# Patient Record
Sex: Male | Born: 1985 | Race: White | Hispanic: No | Marital: Married | Smoking: Former smoker
Health system: Southern US, Community
[De-identification: ages and names within clinical notes are randomized; demographics above are authoritative.]

## PROBLEM LIST (undated history)

## (undated) DIAGNOSIS — Z9109 Other allergy status, other than to drugs and biological substances: Secondary | ICD-10-CM

## (undated) DIAGNOSIS — F419 Anxiety disorder, unspecified: Secondary | ICD-10-CM

## (undated) DIAGNOSIS — R519 Headache, unspecified: Secondary | ICD-10-CM

## (undated) HISTORY — PX: ANTERIOR CRUCIATE LIGAMENT REPAIR: SHX115

## (undated) HISTORY — PX: WISDOM TOOTH EXTRACTION: SHX21

## (undated) HISTORY — DX: Headache, unspecified: R51.9

---
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
## 2015-01-08 ENCOUNTER — Ambulatory Visit: Payer: Self-pay | Admitting: Licensed Clinical Social Worker

## 2015-01-22 ENCOUNTER — Ambulatory Visit: Payer: Self-pay | Admitting: Licensed Clinical Social Worker

## 2015-02-04 ENCOUNTER — Ambulatory Visit (INDEPENDENT_AMBULATORY_CARE_PROVIDER_SITE_OTHER): Payer: BLUE CROSS/BLUE SHIELD | Admitting: Licensed Clinical Social Worker

## 2015-02-04 DIAGNOSIS — F419 Anxiety disorder, unspecified: Secondary | ICD-10-CM

## 2015-02-21 ENCOUNTER — Ambulatory Visit (INDEPENDENT_AMBULATORY_CARE_PROVIDER_SITE_OTHER): Payer: BLUE CROSS/BLUE SHIELD | Admitting: Licensed Clinical Social Worker

## 2015-02-21 DIAGNOSIS — F419 Anxiety disorder, unspecified: Secondary | ICD-10-CM | POA: Diagnosis not present

## 2015-03-05 ENCOUNTER — Ambulatory Visit (INDEPENDENT_AMBULATORY_CARE_PROVIDER_SITE_OTHER): Payer: BLUE CROSS/BLUE SHIELD | Admitting: Licensed Clinical Social Worker

## 2015-03-05 DIAGNOSIS — F419 Anxiety disorder, unspecified: Secondary | ICD-10-CM | POA: Diagnosis not present

## 2015-03-18 ENCOUNTER — Ambulatory Visit (INDEPENDENT_AMBULATORY_CARE_PROVIDER_SITE_OTHER): Payer: BLUE CROSS/BLUE SHIELD | Admitting: Licensed Clinical Social Worker

## 2015-03-18 DIAGNOSIS — F419 Anxiety disorder, unspecified: Secondary | ICD-10-CM | POA: Diagnosis not present

## 2015-04-21 ENCOUNTER — Ambulatory Visit: Payer: BLUE CROSS/BLUE SHIELD | Admitting: Licensed Clinical Social Worker

## 2015-06-24 DIAGNOSIS — Z23 Encounter for immunization: Secondary | ICD-10-CM | POA: Diagnosis not present

## 2015-06-25 ENCOUNTER — Ambulatory Visit (INDEPENDENT_AMBULATORY_CARE_PROVIDER_SITE_OTHER): Payer: BLUE CROSS/BLUE SHIELD | Admitting: Licensed Clinical Social Worker

## 2015-06-25 DIAGNOSIS — F419 Anxiety disorder, unspecified: Secondary | ICD-10-CM

## 2015-07-23 ENCOUNTER — Ambulatory Visit: Payer: Self-pay | Admitting: Licensed Clinical Social Worker

## 2015-12-19 DIAGNOSIS — Z23 Encounter for immunization: Secondary | ICD-10-CM | POA: Diagnosis not present

## 2016-05-04 DIAGNOSIS — M545 Low back pain: Secondary | ICD-10-CM | POA: Diagnosis not present

## 2016-05-21 ENCOUNTER — Ambulatory Visit (INDEPENDENT_AMBULATORY_CARE_PROVIDER_SITE_OTHER): Payer: BLUE CROSS/BLUE SHIELD | Admitting: Licensed Clinical Social Worker

## 2016-05-21 DIAGNOSIS — F419 Anxiety disorder, unspecified: Secondary | ICD-10-CM

## 2016-07-22 ENCOUNTER — Encounter (HOSPITAL_COMMUNITY): Payer: Self-pay | Admitting: Emergency Medicine

## 2016-07-22 ENCOUNTER — Emergency Department (HOSPITAL_COMMUNITY)
Admission: EM | Admit: 2016-07-22 | Discharge: 2016-07-22 | Disposition: A | Discharge status: Home / Self Care | Payer: BLUE CROSS/BLUE SHIELD | Attending: Emergency Medicine | Admitting: Emergency Medicine

## 2016-07-22 DIAGNOSIS — L299 Pruritus, unspecified: Secondary | ICD-10-CM | POA: Diagnosis not present

## 2016-07-22 DIAGNOSIS — L559 Sunburn, unspecified: Secondary | ICD-10-CM | POA: Diagnosis not present

## 2016-07-22 DIAGNOSIS — L550 Sunburn of first degree: Secondary | ICD-10-CM | POA: Diagnosis not present

## 2016-07-22 MED ORDER — HYDROXYZINE HCL 50 MG/ML IM SOLN
100.0000 mg | Freq: Four times a day (QID) | INTRAMUSCULAR | Status: DC | PRN
  Administered 2016-07-22: 100 mg via INTRAMUSCULAR
  Filled 2016-07-22: qty 2

## 2016-07-22 MED ORDER — HYDROXYZINE HCL 25 MG PO TABS: 50.0000 mg | ORAL_TABLET | Freq: Four times a day (QID) | ORAL | 0 refills | Status: DC

## 2016-07-22 MED ORDER — IBUPROFEN 800 MG PO TABS: 800.0000 mg | ORAL_TABLET | Freq: Three times a day (TID) | ORAL | 0 refills | Status: DC

## 2016-07-22 MED ORDER — DOXEPIN HCL 10 MG PO CAPS: 10.0000 mg | ORAL_CAPSULE | Freq: Three times a day (TID) | ORAL | 0 refills | Status: DC | PRN

## 2016-07-22 MED ORDER — DOXEPIN HCL 10 MG PO CAPS
10.0000 mg | ORAL_CAPSULE | Freq: Once | ORAL | Status: AC
  Administered 2016-07-22: 10 mg via ORAL
  Filled 2016-07-22: qty 1

## 2016-07-22 MED ORDER — KETOROLAC TROMETHAMINE 60 MG/2ML IM SOLN
60.0000 mg | Freq: Once | INTRAMUSCULAR | Status: AC
  Administered 2016-07-22: 60 mg via INTRAMUSCULAR
  Filled 2016-07-22: qty 2

## 2016-07-22 NOTE — ED Provider Notes (Signed)
[facility]EMER[facility]ENCY DEPT Provider Note   CSN: [id number] Arrival date & time: 07/22/16  0115     History   Chief Complaint Chief Complaint  Patient presents with  . Pruritis    HPI Benjamin Flores is a 31 y.o. male.  Patient presents to the emergency department for evaluation of severe pruritus secondary to sunburn. Patient reports that he was in the sun several days ago and suffered a sunburn on his face and shoulders. Since then he has had severe, constant pruritus as well as burning pain in the area. He has been taking Allegra and Benadryl without relief. He saw his doctor yesterday and started prednisone which he thinks made it worse. No oral lesions noted. No tongue swelling, throat swelling, difficulty breathing. Patient reports that he has had similar responses to sunburns in the past, but the itching is worse this time than ever before.      History reviewed. No pertinent past medical history.  There are no active problems to display for this patient.   Past Surgical History:  Procedure Laterality Date  . ANTERIOR CRUCIATE LIGAMENT REPAIR Right   . WISDOM TOOTH EXTRACTION         Home Medications    Prior to Admission medications   Not on File    Family History No family history on file.  Social History Social History  Substance Use Topics  . Smoking status: Never Smoker  . Smokeless tobacco: Never Used  . Alcohol use Yes     Comment: socially     Allergies   Patient has no known allergies.   Review of Systems Review of Systems  Skin:       Sunburn, itching  All other systems reviewed and are negative.    Physical Exam Updated Vital Signs Ht 5\' 9"  (1.753 m)   Wt 79.4 kg (175 lb)   BMI 25.84 kg/m   Physical Exam  Constitutional: He is oriented to person, place, and time. He appears well-developed and well-nourished. No distress.  HENT:  Head: Normocephalic and atraumatic.  Right Ear: Hearing normal.  Left Ear: Hearing normal.  Nose:  Nose normal.  Mouth/Throat: Oropharynx is clear and moist and mucous membranes are normal.  Eyes: Conjunctivae and EOM are normal. Pupils are equal, round, and reactive to light.  Neck: Normal range of motion. Neck supple.  Cardiovascular: Regular rhythm, S1 normal and S2 normal.  Exam reveals no gallop and no friction rub.   No murmur heard. Pulmonary/Chest: Effort normal and breath sounds normal. No respiratory distress. He exhibits no tenderness.  Abdominal: Soft. Normal appearance and bowel sounds are normal. There is no hepatosplenomegaly. There is no tenderness. There is no rebound, no guarding, no tenderness at McBurney's point and negative Murphy's sign. No hernia.  Musculoskeletal: Normal range of motion.  Neurological: He is alert and oriented to person, place, and time. He has normal strength. No cranial nerve deficit or sensory deficit. Coordination normal. GCS eye subscore is 4. GCS verbal subscore is 5. GCS motor subscore is 6.  Skin: Skin is warm, dry and intact. No rash noted. There is erythema (Face and apex of shoulders bilaterally). No cyanosis.  Psychiatric: He has a normal mood and affect. His speech is normal and behavior is normal. Thought content normal.  Nursing note and vitals reviewed.    ED Treatments / Results  Labs (all labs ordered are listed, but only abnormal results are displayed) Labs Reviewed - No data to display  EKG  EKG  Interpretation None       Radiology No results found.  Procedures Procedures (including critical care time)  Medications Ordered in ED Medications  doxepin (SINEQUAN) capsule 10 mg (not administered)  hydrOXYzine (VISTARIL) injection 100 mg (not administered)  ketorolac (TORADOL) injection 60 mg (not administered)     Initial Impression / Assessment and Plan / ED Course  I have reviewed the triage vital signs and the nursing notes.  Pertinent labs & imaging results that were available during my care of the patient  were reviewed by me and considered in my medical decision making (see chart for details).     Patient presents with itching associated with sunburn which happened a couple of days ago. Patient reports similar reactions in the past. Patient has mild sunburn on examination, but reports deep burning pain and itching. He was treated with Vistaril, Toradol, doxepin. Will continue Vistaril and doxepin as an outpatient as needed, petroleum jelly topical application to the areas.  Final Clinical Impressions(s) / ED Diagnoses   Final diagnoses:  Pruritus  Sunburn    New Prescriptions New Prescriptions   No medications on file     Gilda CreasePollina, Kristee Angus J, MD 07/22/16 [phone]

## 2016-07-22 NOTE — ED Notes (Signed)
ED Provider at bedside. 

## 2016-07-22 NOTE — ED Notes (Signed)
Pt at vending machines eating.

## 2016-07-22 NOTE — ED Notes (Signed)
Patient walked out front door without addressing staff.  Unsure if patient was leaving, will continue to monitor and d/c if he does not return.

## 2016-07-22 NOTE — ED Triage Notes (Signed)
Pt to ED from home c/o severe itching to shoulders and arms since Monday - pt reports serious allergy to the sun and got burnt Monday on his arms and shoulders. Pt states he's tried OTC Allegra and Benadryl without relief and saw his PCP yesterday (Wednesday) when the itching worsened, and was prescribed Prednisone (took first day's dose). Pt states the itching feels like it is down to his bones, he can't sleep. Denies pain or any other symptoms.

## 2016-07-22 NOTE — ED Notes (Signed)
Patient now back in lobby, sitting, waiting

## 2016-07-22 NOTE — ED Notes (Signed)
Patient updated on wait time and apologized for delays 

## 2016-07-30 DIAGNOSIS — F419 Anxiety disorder, unspecified: Secondary | ICD-10-CM | POA: Diagnosis not present

## 2016-07-30 DIAGNOSIS — E781 Pure hyperglyceridemia: Secondary | ICD-10-CM | POA: Diagnosis not present

## 2016-07-30 DIAGNOSIS — Z0000 Encounter for general adult medical examination without abnormal findings: Secondary | ICD-10-CM | POA: Diagnosis not present

## 2016-07-30 DIAGNOSIS — B354 Tinea corporis: Secondary | ICD-10-CM | POA: Diagnosis not present

## 2016-09-17 ENCOUNTER — Emergency Department (HOSPITAL_COMMUNITY): Payer: BLUE CROSS/BLUE SHIELD

## 2016-09-17 ENCOUNTER — Emergency Department (HOSPITAL_COMMUNITY)
Admission: EM | Admit: 2016-09-17 | Discharge: 2016-09-17 | Disposition: A | Discharge status: Home / Self Care | Payer: BLUE CROSS/BLUE SHIELD | Attending: Emergency Medicine | Admitting: Emergency Medicine

## 2016-09-17 ENCOUNTER — Encounter (HOSPITAL_COMMUNITY): Payer: Self-pay | Admitting: Nurse Practitioner

## 2016-09-17 DIAGNOSIS — F430 Acute stress reaction: Secondary | ICD-10-CM | POA: Diagnosis not present

## 2016-09-17 DIAGNOSIS — Z79899 Other long term (current) drug therapy: Secondary | ICD-10-CM | POA: Insufficient documentation

## 2016-09-17 DIAGNOSIS — R079 Chest pain, unspecified: Secondary | ICD-10-CM | POA: Diagnosis not present

## 2016-09-17 DIAGNOSIS — R9431 Abnormal electrocardiogram [ECG] [EKG]: Secondary | ICD-10-CM | POA: Diagnosis not present

## 2016-09-17 DIAGNOSIS — F410 Panic disorder [episodic paroxysmal anxiety] without agoraphobia: Secondary | ICD-10-CM | POA: Diagnosis not present

## 2016-09-17 HISTORY — DX: Anxiety disorder, unspecified: F41.9

## 2016-09-17 HISTORY — DX: Other allergy status, other than to drugs and biological substances: Z91.09

## 2016-09-17 LAB — CBC
HCT: 43 % (ref 39.0–52.0)
Hemoglobin: 15 g/dL (ref 13.0–17.0)
MCH: 31.4 pg (ref 26.0–34.0)
MCHC: 34.9 g/dL (ref 30.0–36.0)
MCV: 90 fL (ref 78.0–100.0)
PLATELETS: 164 10*3/uL (ref 150–400)
RBC: 4.78 MIL/uL (ref 4.22–5.81)
RDW: 11.9 % (ref 11.5–15.5)
WBC: 3.8 10*3/uL — ABNORMAL LOW (ref 4.0–10.5)

## 2016-09-17 LAB — I-STAT TROPONIN, ED: TROPONIN I, POC: 0.01 ng/mL (ref 0.00–0.08)

## 2016-09-17 LAB — BASIC METABOLIC PANEL
Anion gap: 10 (ref 5–15)
BUN: 15 mg/dL (ref 6–20)
CALCIUM: 9.2 mg/dL (ref 8.9–10.3)
CO2: 24 mmol/L (ref 22–32)
CREATININE: 0.9 mg/dL (ref 0.61–1.24)
Chloride: 105 mmol/L (ref 101–111)
GFR calc non Af Amer: >60 mL/min (ref >60)
Glucose, Bld: 99 mg/dL (ref 65–99)
Potassium: 3.9 mmol/L (ref 3.5–5.1)
SODIUM: 139 mmol/L (ref 135–145)

## 2016-09-17 NOTE — ED Triage Notes (Signed)
Pt presents with c/o abnormal ekg. He was sent from his primary care provider this morning for further evaluation. He went in to the office today for several day history of intermittent chest pressure. He reports the pressure has lasted for several hours at a time and seems to start during stressful events. He reports feeling his heart race during the episodes. He denies weakness, dizziness, shortness of breath.

## 2016-09-17 NOTE — Discharge Instructions (Signed)
We believe the changes in your EKG do not represent decreased blood flow to your heart.   We believe your chest pressure was due to a panic attack triggered by anxiety.  Get help right away if: You experience panic attack symptoms that are different than your usual symptoms. You have serious thoughts about hurting yourself or others. You are taking medicine for panic attacks and have a serious side effect.

## 2016-09-17 NOTE — ED Provider Notes (Signed)
[facility] Provider Note   CSN: [id number] Arrival date & time: 09/17/16  1228     History   Chief Complaint Chief Complaint  Patient presents with  . Abnormal ECG    HPI Benjamin Flores is a 31 y.o. male.  Patient with a history of allergies and anxiety and presenting from his primary care provider with an abnormal EKG. He was seen by his primary care this mornig for nausea and diarrhea following an episode of chest tightness. His chest tightness began last night following stressful conversations with family. It was severe chest tightness that kept him awake most of the night and did not subside until around 8-9 in the morning. He is not currently feeling chest tightness. He has had a previous episode of chest tightness following a similar stressful situation. He denies weakness, dizziness, or shortness of breath during the episode.      Past Medical History:  Diagnosis Date  . Anxiety   . Environmental allergies     There are no active problems to display for this patient.   Past Surgical History:  Procedure Laterality Date  . ANTERIOR CRUCIATE LIGAMENT REPAIR Right   . WISDOM TOOTH EXTRACTION         Home Medications    Prior to Admission medications   Medication Sig Start Date End Date Taking? Authorizing Provider  diphenhydrAMINE (BENADRYL) 25 MG tablet Take 25 mg by mouth every 6 (six) hours as needed for allergies.    [provider]  doxepin (SINEQUAN) 10 MG capsule Take 1 capsule (10 mg total) by mouth 3 (three) times daily as needed (itching). 07/22/16   Gilda CreasePollina, Christopher J, MD  fexofenadine (ALLEGRA) 180 MG tablet Take 180 mg by mouth daily.    [provider]  hydrOXYzine (ATARAX/VISTARIL) 25 MG tablet Take 2-4 tablets (50-100 mg total) by mouth every 6 (six) hours. 07/22/16   Gilda CreasePollina, Christopher J, MD  ibuprofen (ADVIL,MOTRIN) 800 MG tablet Take 1 tablet (800 mg total) by mouth 3 (three) times daily. 07/22/16   Gilda CreasePollina, Christopher  J, MD  Multiple Vitamin (MULTIVITAMIN WITH MINERALS) TABS tablet Take 1 tablet by mouth daily.    [provider]  predniSONE (DELTASONE) 10 MG tablet Take 10-20 mg by mouth See admin instructions. Day 1: Two tablets before breakfast, one after lunch, one after dinner, and two at bedtime. If started late in the day, take two tablets every hour for three hours, unless otherwise directed by prescriber. Day 2: One tablet before breakfast, one after lunch, one after dinner, and two at bedtime Day 3: One tablet before breakfast, one after lunch, one after dinner, and one at bedtime Day 4: One tablet before breakfast, one after lunch, and one at bedtime Day 5: One tablet before breakfast and one at bedtime Day 6: One tablet before breakfast    [provider]    Family History History reviewed. No pertinent family history.  Social History Social History  Substance Use Topics  . Smoking status: Never Smoker  . Smokeless tobacco: Never Used  . Alcohol use Yes     Comment: socially     Allergies   Patient has no known allergies.   Review of Systems Review of Systems  Constitutional: Negative for chills and fever.  HENT: Negative for ear pain and sore throat.   Eyes: Negative for pain and visual disturbance.  Respiratory: Negative for cough and shortness of breath.   Cardiovascular: Negative for chest pain and palpitations.  Gastrointestinal:  Negative for abdominal pain and vomiting.  Genitourinary: Negative for dysuria and hematuria.  Musculoskeletal: Negative for arthralgias and back pain.  Skin: Negative for color change and rash.  Neurological: Negative for seizures and syncope.  All other systems reviewed and are negative.    Physical Exam Updated Vital Signs BP 127/87   Pulse 76   Temp 98 F (36.7 C) (Oral)   Resp 16   Ht 5\' 9"  (1.753 m)   Wt 79.4 kg (175 lb)   SpO2 98%   BMI 25.84 kg/m   Physical Exam  Constitutional: He appears well-developed  and well-nourished.  HENT:  Head: Normocephalic and atraumatic.  Eyes: Conjunctivae are normal.  Neck: Neck supple.  Cardiovascular: Normal rate and regular rhythm.   No murmur heard. Pulmonary/Chest: Effort normal and breath sounds normal. No respiratory distress.  Abdominal: Soft. There is no tenderness.  Musculoskeletal: He exhibits no edema.  Neurological: He is alert.  Skin: Skin is warm and dry.  Psychiatric: He has a normal mood and affect.  Nursing note and vitals reviewed.    ED Treatments / Results  Labs (all labs ordered are listed, but only abnormal results are displayed) Labs Reviewed  CBC - Abnormal; Notable for the following:       Result Value   WBC 3.8 (*)    All other components within normal limits  BASIC METABOLIC PANEL  I-STAT TROPONIN, ED    EKG  EKG Interpretation  Date/Time:  Friday September 17 2016 12:31:59 EDT Ventricular Rate:  72 PR Interval:  142 QRS Duration: 80 QT Interval:  380 QTC Calculation: 416 R Axis:   96 Text Interpretation:  Normal sinus rhythm Right atrial enlargement Rightward axis Pulmonary disease pattern Abnormal ECG No STEMI.  Confirmed by Alona BeneLong, Joshua [phone]([id number]) on 09/17/2016 12:41:32 PM       Radiology Dg Chest 2 View  Result Date: 09/17/2016 CLINICAL DATA:  Chest pain. EXAM: CHEST  2 VIEW COMPARISON:  None. FINDINGS: The heart size and mediastinal contours are within normal limits. Both lungs are clear. No pneumothorax or pleural effusion is noted. The visualized skeletal structures are unremarkable. IMPRESSION: No active cardiopulmonary disease. Electronically Signed   By: Lupita RaiderJames  Green Jr, M.D.   On: 09/17/2016 12:50    Procedures Procedures (including critical care time)  Medications Ordered in ED Medications - No data to display   Initial Impression / Assessment and Plan / ED Course  I have reviewed the triage vital signs and the nursing notes.  Pertinent labs & imaging results that were available during my care  of the patient were reviewed by me and considered in my medical decision making (see chart for details).    Patient presenting with abnormal EKG from primary care office following episode of chest tightness. The chest tightness occurred in the setting of recent stressful conversations with family and with a history of anxiety. History is suspicious for panic attack. CXR shows   - EKG: nonischemic Normal sinus rhythm with sinus arrhythmia Right atrial enlargement Rightward axis Pulmonary disease pattern Abnormal ECG - Troponin 0.01 - CBC & BMP: No significant derangements  Work up negative for cardiac origin of chest pressure. Suspect panic attack based on presentation, HPI and history of anxiety.   Final Clinical Impressions(s) / ED Diagnoses   Final diagnoses:  None    New Prescriptions New Prescriptions   No medications on file     Beola CordMelvin, Alexander, MD 09/17/16 1425    Long, Arlyss RepressJoshua G, MD 09/17/16  1606  

## 2016-09-17 NOTE — ED Notes (Signed)
Pt states that he woke up in middle of night nauseated with joint pain in all joints.

## 2016-10-13 ENCOUNTER — Ambulatory Visit: Payer: BLUE CROSS/BLUE SHIELD | Admitting: Cardiology

## 2016-11-09 ENCOUNTER — Ambulatory Visit: Payer: BLUE CROSS/BLUE SHIELD | Admitting: Cardiology

## 2016-12-10 DIAGNOSIS — Z23 Encounter for immunization: Secondary | ICD-10-CM | POA: Diagnosis not present

## 2017-01-09 DIAGNOSIS — J029 Acute pharyngitis, unspecified: Secondary | ICD-10-CM | POA: Diagnosis not present

## 2017-01-09 DIAGNOSIS — M791 Myalgia, unspecified site: Secondary | ICD-10-CM | POA: Diagnosis not present

## 2017-01-09 DIAGNOSIS — J101 Influenza due to other identified influenza virus with other respiratory manifestations: Secondary | ICD-10-CM | POA: Diagnosis not present

## 2017-02-14 DIAGNOSIS — A084 Viral intestinal infection, unspecified: Secondary | ICD-10-CM | POA: Diagnosis not present

## 2017-02-24 ENCOUNTER — Ambulatory Visit: Payer: BLUE CROSS/BLUE SHIELD | Admitting: Cardiology

## 2017-02-24 ENCOUNTER — Encounter: Payer: Self-pay | Admitting: Cardiology

## 2017-02-24 VITALS — BP 102/78 | Ht 69.0 in | Wt 167.4 lb

## 2017-02-24 DIAGNOSIS — R9431 Abnormal electrocardiogram [ECG] [EKG]: Secondary | ICD-10-CM

## 2017-02-24 DIAGNOSIS — R079 Chest pain, unspecified: Secondary | ICD-10-CM | POA: Diagnosis not present

## 2017-02-24 NOTE — Patient Instructions (Signed)
No  Change with treatment       Your physician recommends that you schedule a follow-up appointment on an as needed basis

## 2017-02-24 NOTE — Progress Notes (Signed)
PCP: Patient, No Pcp Per  - Dr. Zachery Dauer - [facility] Note: Chief Complaint  Patient presents with  . new patient     shooting chest pain  occassionally, no shortness of breathe , noswelling    HPI: Benjamin Flores is a 32 y.o. male with a PMH below who presents today for delayed emergency room follow-up for abnormal EKG & chest pain.  He was sent to the emergency room by primary care provider because of an abnormal EKG.  Benjamin Flores was seen on by Dr. Alona Bene on September 17, 2016 for chest discomfort.  The EKG did not suggest ischemia, however was read as abnormal because of suggestion of rightward axis (96 degrees).  Chest x-ray was normal.  Plan was to follow-up with primary care physician, but was provided cardiology contact information on discharge. --The original reason for presenting to the PCP was nausea and diarrhea following an episode of chest tightness that began after a stressful conversation with the family.  He describes severe chest tightness that kept him awake most of the night until 9 in the morning.  This was his second such episode.  I cannot tell if he has been seen by his PCP since the emergency room visit or not.  Recent Hospitalizations: None recorded since August  Studies Personally Reviewed - (if available, images/films reviewed: From Epic Chart or Care Everywhere)  none  Interval History: Benjamin Flores indicates that he has been doing relatively well since the emergency room follow-up.  He has seen his PCP, but not for this issue.  Basically, he agreed to come to this visit because his wife is concerned.  The concern based on the issue relating to the emergency room was, that was the worst episode of similar Sx - stress & anxiety related to family issues.  Felt significant tightness in chest -no SOB associated with it.  Off & on short, fleeting pains in the chest - not associated with activity.  Out of shape - so would get SOB & tired with vigorous activity,  but nothing unexpected.   Naturally anxious.    Otherwise no further chest pain or shortness of breath with rest or exertion.  No PND, orthopnea or edema. No palpitations, lightheadedness, dizziness, weakness or syncope/near syncope. No TIA/amaurosis fugax symptoms.  No claudication.  ROS: A comprehensive was performed. Review of Systems  Constitutional: Negative for malaise/fatigue.  HENT: Negative for congestion and nosebleeds.   Respiratory: Negative for cough and shortness of breath.   Gastrointestinal: Negative for blood in stool and melena.  Genitourinary: Negative for hematuria.  Neurological: Negative for dizziness and weakness.  Endo/Heme/Allergies: Negative for environmental allergies.  Psychiatric/Behavioral: The patient is nervous/anxious (Generally anxious person.  But no depression.).   All other systems reviewed and are negative.   I have reviewed and (if needed) personally updated the patient's problem list, medications, allergies, past medical and surgical history, social and family history.   Past Medical History:  Diagnosis Date  . Anxiety   . Environmental allergies     Past Surgical History:  Procedure Laterality Date  . ANTERIOR CRUCIATE LIGAMENT REPAIR Right   . WISDOM TOOTH EXTRACTION      Current Meds  Medication Sig  . Multiple Vitamin (MULTIVITAMIN WITH MINERALS) TABS tablet Take 1 tablet by mouth daily.    No Known Allergies  Social History   Tobacco Use  . Smoking status: Former Smoker    Types: Cigarettes    Last  attempt to quit: 2012    Years since quitting: 7.0  . Smokeless tobacco: Never Used  Substance Use Topics  . Alcohol use: Yes    Comment: socially  . Drug use: No   Social History   Social History Narrative   Married - 1 child (1.5 y/o dtr).   Used to smoke 1/2 ppd x 6-8 yrs - quit 6 yr ago    family history includes Alzheimer's disease in his maternal grandmother; Bipolar disorder in his father; Dementia in his  father and paternal grandfather; Hypertension in his father; Prostate cancer in his father.  NO premature CAD, DM.  Both sides have "high cholesterol' Grandparents - some had dementia.   Wt Readings from Last 3 Encounters:  02/24/17 167 lb 6.4 oz (75.9 kg)  09/17/16 175 lb (79.4 kg)  07/22/16 175 lb (79.4 kg)    PHYSICAL EXAM BP 102/78 (BP Location: Right Arm, Patient Position: Sitting, Cuff Size: Normal)   Ht 5\' 9"  (1.753 m)   Wt 167 lb 6.4 oz (75.9 kg)   BMI 24.72 kg/m  Physical Exam  Constitutional: He is oriented to person, place, and time. He appears well-developed and well-nourished. No distress.  HENT:  Head: Normocephalic and atraumatic.  Mouth/Throat: Oropharynx is clear and moist. No oropharyngeal exudate.  Eyes: EOM are normal.  Neck: Normal range of motion. Neck supple. No hepatojugular reflux and no JVD present. Carotid bruit is not present.  Cardiovascular: Normal rate, regular rhythm and intact distal pulses.  No extrasystoles are present. PMI is not displaced. Exam reveals no gallop and no friction rub.  No murmur heard. Pulmonary/Chest: Effort normal and breath sounds normal. No respiratory distress. He has no wheezes. He has no rales.  Abdominal: Soft. Bowel sounds are normal. He exhibits no distension. There is no tenderness. There is no rebound.  Musculoskeletal: Normal range of motion. He exhibits no edema.  Neurological: He is alert and oriented to person, place, and time.  Skin: Skin is warm and dry.  Psychiatric: He has a normal mood and affect. His behavior is normal. Judgment and thought content normal.  Nursing note and vitals reviewed.    Adult ECG Report  Rate: 53 ;  Rhythm: sinus bradycardia and Otherwise normal axis, intervals and durations.;   Narrative Interpretation: Normal EKG with exception of sinus bradycardia.  The previously noted rightward axis deviation is no longer present.  He does have low voltage in the I and aVL.  (Suspect previous EKG  was related to lead placement)   Other studies Reviewed: Additional studies/ records that were reviewed today include:  Recent Labs:   Lab Results  Component Value Date   CREATININE 0.90 09/17/2016   BUN 15 09/17/2016   NA 139 09/17/2016   K 3.9 09/17/2016   CL 105 09/17/2016   CO2 24 09/17/2016      ASSESSMENT / PLAN:  Benjamin Flores is an otherwise healthy young man expected to size may be hypercholesterolemia and stress/anxiety.  His EKG is relatively normal.  He has not had any significant chest pain since his ER visit.  I suspect if this episode was clearly related to a panic and anxiety type attack.  Does not sound cardiac in nature.  With him not having any exertional chest tightness or pressure or dyspnea, I do not think there is any need to further evaluate this with a stress test.  If he were to have recurrent symptoms, we would check a stress test. On exam he did not  have any murmurs or rubs or gallops.  With follow-up EKG showing normal axis, I do not see any reason for checking it with an echocardiogram.  I reassured him that his symptoms are noncardiac.  He can follow-up with his PCP.  Problem List Items Addressed This Visit    Chest pain with low risk for cardiac etiology - Primary (Chronic)   Relevant Orders   EKG 12-Lead (Completed)   Nonspecific abnormal electrocardiogram (ECG) (EKG)   Relevant Orders   EKG 12-Lead (Completed)      Current medicines are reviewed at length with the patient today. (+/- concerns) none The following changes have been made:None  Patient Instructions  No  Change with treatment       Your physician recommends that you schedule a follow-up appointment on an as needed basis    Studies Ordered:   Orders Placed This Encounter  Procedures  . EKG 12-Lead      Bryan Lemmaavid Bryana Froemming, M.D., M.S. Interventional Cardiologist   Pager # [phone] Phone # [phone] [address] [place], [place] [address]   Thank you  for choosing [facility]!!

## 2017-02-27 ENCOUNTER — Encounter: Payer: Self-pay | Admitting: Cardiology

## 2017-06-14 DIAGNOSIS — N529 Male erectile dysfunction, unspecified: Secondary | ICD-10-CM | POA: Diagnosis not present

## 2017-06-14 DIAGNOSIS — E7800 Pure hypercholesterolemia, unspecified: Secondary | ICD-10-CM | POA: Diagnosis not present

## 2017-06-14 DIAGNOSIS — E039 Hypothyroidism, unspecified: Secondary | ICD-10-CM | POA: Diagnosis not present

## 2017-12-07 DIAGNOSIS — Z23 Encounter for immunization: Secondary | ICD-10-CM | POA: Diagnosis not present

## 2018-03-09 DIAGNOSIS — M25561 Pain in right knee: Secondary | ICD-10-CM | POA: Diagnosis not present

## 2018-03-09 DIAGNOSIS — M7651 Patellar tendinitis, right knee: Secondary | ICD-10-CM | POA: Diagnosis not present

## 2018-03-13 DIAGNOSIS — R05 Cough: Secondary | ICD-10-CM | POA: Diagnosis not present

## 2018-03-13 DIAGNOSIS — J069 Acute upper respiratory infection, unspecified: Secondary | ICD-10-CM | POA: Diagnosis not present

## 2018-03-22 DIAGNOSIS — M25561 Pain in right knee: Secondary | ICD-10-CM | POA: Diagnosis not present

## 2018-03-29 DIAGNOSIS — M25561 Pain in right knee: Secondary | ICD-10-CM | POA: Diagnosis not present

## 2018-04-06 DIAGNOSIS — N5201 Erectile dysfunction due to arterial insufficiency: Secondary | ICD-10-CM | POA: Diagnosis not present

## 2018-04-06 DIAGNOSIS — Z3009 Encounter for other general counseling and advice on contraception: Secondary | ICD-10-CM | POA: Diagnosis not present

## 2018-04-06 DIAGNOSIS — R351 Nocturia: Secondary | ICD-10-CM | POA: Diagnosis not present

## 2019-02-05 DIAGNOSIS — F419 Anxiety disorder, unspecified: Secondary | ICD-10-CM | POA: Diagnosis not present

## 2019-07-23 ENCOUNTER — Other Ambulatory Visit: Payer: BLUE CROSS/BLUE SHIELD

## 2020-06-29 ENCOUNTER — Other Ambulatory Visit: Payer: Self-pay

## 2020-06-29 ENCOUNTER — Emergency Department (HOSPITAL_BASED_OUTPATIENT_CLINIC_OR_DEPARTMENT_OTHER)
Admission: EM | Admit: 2020-06-29 | Discharge: 2020-06-29 | Disposition: A | Discharge status: Home / Self Care | Payer: 59 | Attending: Emergency Medicine | Admitting: Emergency Medicine

## 2020-06-29 ENCOUNTER — Emergency Department (HOSPITAL_BASED_OUTPATIENT_CLINIC_OR_DEPARTMENT_OTHER): Payer: 59

## 2020-06-29 ENCOUNTER — Encounter (HOSPITAL_BASED_OUTPATIENT_CLINIC_OR_DEPARTMENT_OTHER): Payer: Self-pay | Admitting: Emergency Medicine

## 2020-06-29 DIAGNOSIS — Z87891 Personal history of nicotine dependence: Secondary | ICD-10-CM | POA: Insufficient documentation

## 2020-06-29 DIAGNOSIS — R531 Weakness: Secondary | ICD-10-CM | POA: Diagnosis not present

## 2020-06-29 DIAGNOSIS — R519 Headache, unspecified: Secondary | ICD-10-CM | POA: Diagnosis present

## 2020-06-29 LAB — CBC WITH DIFFERENTIAL/PLATELET
Abs Immature Granulocytes: 0.01 10*3/uL (ref 0.00–0.07)
Basophils Absolute: 0 10*3/uL (ref 0.0–0.1)
Basophils Relative: 1 %
Eosinophils Absolute: 0.2 10*3/uL (ref 0.0–0.5)
Eosinophils Relative: 4 %
HCT: 43.1 % (ref 39.0–52.0)
Hemoglobin: 14.8 g/dL (ref 13.0–17.0)
Immature Granulocytes: 0 %
Lymphocytes Relative: 39 %
Lymphs Abs: 1.7 10*3/uL (ref 0.7–4.0)
MCH: 31.4 pg (ref 26.0–34.0)
MCHC: 34.3 g/dL (ref 30.0–36.0)
MCV: 91.5 fL (ref 80.0–100.0)
Monocytes Absolute: 0.5 10*3/uL (ref 0.1–1.0)
Monocytes Relative: 12 %
Neutro Abs: 1.9 10*3/uL (ref 1.7–7.7)
Neutrophils Relative %: 44 %
Platelets: 182 10*3/uL (ref 150–400)
RBC: 4.71 MIL/uL (ref 4.22–5.81)
RDW: 12 % (ref 11.5–15.5)
WBC: 4.3 10*3/uL (ref 4.0–10.5)
nRBC: 0 % (ref 0.0–0.2)

## 2020-06-29 LAB — COMPREHENSIVE METABOLIC PANEL
ALT: 32 U/L (ref 0–44)
AST: 25 U/L (ref 15–41)
Albumin: 4.3 g/dL (ref 3.5–5.0)
Alkaline Phosphatase: 52 U/L (ref 38–126)
Anion gap: 9 (ref 5–15)
BUN: 18 mg/dL (ref 6–20)
CO2: 25 mmol/L (ref 22–32)
Calcium: 8.6 mg/dL — ABNORMAL LOW (ref 8.9–10.3)
Chloride: 105 mmol/L (ref 98–111)
Creatinine, Ser: 0.82 mg/dL (ref 0.61–1.24)
GFR, Estimated: >60 mL/min (ref >60)
Glucose, Bld: 90 mg/dL (ref 70–99)
Potassium: 3.8 mmol/L (ref 3.5–5.1)
Sodium: 139 mmol/L (ref 135–145)
Total Bilirubin: 0.5 mg/dL (ref 0.3–1.2)
Total Protein: 6.6 g/dL (ref 6.5–8.1)

## 2020-06-29 MED ORDER — SODIUM CHLORIDE 0.9 % IV BOLUS
1000.0000 mL | Freq: Once | INTRAVENOUS | Status: AC
  Administered 2020-06-29: 1000 mL via INTRAVENOUS

## 2020-06-29 MED ORDER — KETOROLAC TROMETHAMINE 30 MG/ML IJ SOLN
30.0000 mg | Freq: Once | INTRAMUSCULAR | Status: AC
  Administered 2020-06-29: 30 mg via INTRAVENOUS
  Filled 2020-06-29: qty 1

## 2020-06-29 MED ORDER — NAPROXEN 500 MG PO TABS: 500.0000 mg | ORAL_TABLET | Freq: Two times a day (BID) | ORAL | 0 refills | Status: AC

## 2020-06-29 NOTE — ED Notes (Signed)
Frontal headache and pressure for two weeks Denies sinus drainage, vision changes, N/V, fever, neck stiffness Had tele health appointment last week - CT was ordered and insurance declined to pay. Pt here because the pain woke him up this morning around 4am Pain/pressure is effecting lifestyle. Has tried tylenol and ibuprofen but nothing has help.

## 2020-06-29 NOTE — ED Provider Notes (Signed)
[facility] EMERGENCY DEPARTMENT Provider Note  CSN: [id number] Arrival date & time: 06/29/20 1253    History Chief Complaint  Patient presents with  . Headache    HPI  Benjamin Flores is a 35 y.o. male with no significant PMH reports about 2 weeks of waxing and waning frontal headache, not associated with sinus pressure/drainage, fever, neck stiffness, photophobia, N/V/D. He has felt generally weak, worse with exertion. He had a televisit with his PCP office earlier this month and a CT was ordered but denied by his insurance. He has been taking OTC meds without relief. Pain woke him up from sleep around 4am more severe than previous. He denies any nose or eye drainage. No arm or leg weakness/numbness.    Past Medical History:  Diagnosis Date  . Anxiety   . Environmental allergies     Past Surgical History:  Procedure Laterality Date  . ANTERIOR CRUCIATE LIGAMENT REPAIR Right   . WISDOM TOOTH EXTRACTION      Family History  Problem Relation Age of Onset  . Hypertension Father   . Prostate cancer Father   . Dementia Father        vascular  . Bipolar disorder Father   . Alzheimer's disease Maternal Grandmother   . Dementia Paternal Grandfather        lots of psychiatric issues    Social History   Tobacco Use  . Smoking status: Former Smoker    Types: Cigarettes    Quit date: 2012    Years since quitting: 10.3  . Smokeless tobacco: Never Used  Vaping Use  . Vaping Use: Never used  Substance Use Topics  . Alcohol use: Yes    Comment: socially  . Drug use: No     Home Medications Prior to Admission medications   Medication Sig Start Date End Date Taking? Authorizing Provider  naproxen (NAPROSYN) 500 MG tablet Take 1 tablet (500 mg total) by mouth 2 (two) times daily. 06/29/20  Yes Pollyann Savoy, MD  Multiple Vitamin (MULTIVITAMIN WITH MINERALS) TABS tablet Take 1 tablet by mouth daily.    [provider]     Allergies    Patient  has no known allergies.   Review of Systems   Review of Systems A comprehensive review of systems was completed and negative except as noted in HPI.    Physical Exam BP 118/86   Pulse 62   Temp 98.8 F (37.1 C) (Oral)   Resp 14   Ht 5\' 9"  (1.753 m)   Wt 81.6 kg   SpO2 100%   BMI 26.58 kg/m   Physical Exam Vitals and nursing note reviewed.  Constitutional:      Appearance: Normal appearance.  HENT:     Head: Normocephalic and atraumatic.     Nose: Nose normal.     Mouth/Throat:     Mouth: Mucous membranes are moist.  Eyes:     Extraocular Movements: Extraocular movements intact.     Conjunctiva/sclera: Conjunctivae normal.     Comments: Pale sclera  Cardiovascular:     Rate and Rhythm: Normal rate.  Pulmonary:     Effort: Pulmonary effort is normal.     Breath sounds: Normal breath sounds.  Abdominal:     General: Abdomen is flat.     Palpations: Abdomen is soft.     Tenderness: There is no abdominal tenderness.  Musculoskeletal:        General: No swelling. Normal range of motion.  Cervical back: Neck supple.  Skin:    General: Skin is warm and dry.  Neurological:     General: No focal deficit present.     Mental Status: He is alert and oriented to person, place, and time.     Cranial Nerves: No cranial nerve deficit.     Sensory: No sensory deficit.     Motor: No weakness.     Gait: Gait normal.  Psychiatric:        Mood and Affect: Mood normal.      ED Results / Procedures / Treatments   Labs (all labs ordered are listed, but only abnormal results are displayed) Labs Reviewed  COMPREHENSIVE METABOLIC PANEL - Abnormal; Notable for the following components:      Result Value   Calcium 8.6 (*)    All other components within normal limits  CBC WITH DIFFERENTIAL/PLATELET    EKG None   Radiology CT Head Wo Contrast  Result Date: 06/29/2020 CLINICAL DATA:  Headache for 2 weeks EXAM: CT HEAD WITHOUT CONTRAST TECHNIQUE: Contiguous axial images  were obtained from the base of the skull through the vertex without intravenous contrast. COMPARISON:  None. FINDINGS: Brain: Ventricles and sulci are normal in size and configuration. There is no intracranial mass, hemorrhage, extra-axial fluid collection, or midline shift. Brain parenchyma appears unremarkable. No evident acute infarct. Vascular: No hyperdense vessel.  No evident vascular calcification. Skull: Bony calvarium appears intact. Sinuses/Orbits: Visualized paranasal sinuses are clear. Visualized orbits appear symmetric bilaterally. Other: Mastoid air cells are clear. IMPRESSION: Study within normal limits. Electronically Signed   By: Bretta Bang III M.D.   On: 06/29/2020 14:47    Procedures Procedures  Medications Ordered in the ED Medications  sodium chloride 0.9 % bolus 1,000 mL (0 mLs Intravenous Stopped 06/29/20 1521)  ketorolac (TORADOL) 30 MG/ML injection 30 mg (30 mg Intravenous Given 06/29/20 1426)     MDM Rules/Calculators/A&P MDM Patient with new onset persistent headache, now also with generalized weakness and exercise intolerance. Will check basic labs, send for CT. IVF in the meantime.  ED Course  I have reviewed the triage vital signs and the nursing notes.  Pertinent labs & imaging results that were available during my care of the patient were reviewed by me and considered in my medical decision making (see chart for details).  Clinical Course as of 06/29/20 1530  Sun Jun 29, 2020  1351 CBC is normal.  [CS]  1412 CMP is normal.  [CS]  1514 CT negative. Patient has had some relief after Toradol. No signs of life threatening cause of headache such as SAH or mass. Recommend Neurology follow up. Rx for NSAIDs. RTED for any other concerns.  [CS]    Clinical Course User Index [CS] Pollyann Savoy, MD    Final Clinical Impression(s) / ED Diagnoses Final diagnoses:  Bad headache    Rx / DC Orders ED Discharge Orders         Ordered    naproxen  (NAPROSYN) 500 MG tablet  2 times daily        06/29/20 1521           Pollyann Savoy, MD 06/29/20 1530

## 2020-06-29 NOTE — ED Triage Notes (Signed)
Pt arrives to ED with c/o of severe head pressure x2 weeks. Pt states he started with headache, brain fog, fatigue and now the pressure has gotten so severe he cannot perform normal daily activities.

## 2020-06-30 ENCOUNTER — Encounter: Payer: Self-pay | Admitting: Neurology

## 2020-09-05 NOTE — Progress Notes (Signed)
NEUROLOGY CONSULTATION NOTE  Benjamin Flores MRN: [id number] DOB: 06/27/1985  Referring provider: Dahlia Byes, NP Primary care provider: [facility]  Reason for consult:  headaches  Assessment/Plan:   Chronic daily headache - unclear how much may be complicated by medication-overuse Memory deficits  Start nortriptyline 10mg  at bedtime.  If no improvement in 4 weeks, contact me and we can increase to 25mg  at bedtime Limit use of pain relievers to no more than 2 days out of week to prevent risk of rebound or medication-overuse headache. Keep headache diary MRI of brain with and without contrast Check B12 and TSH Follow up 6 months    Subjective:  JAKWAN Flores is a 35 year old male with IBS and anxiety who presents for headaches.  History supplemented by ED and referring provider's notes.  About 3 months ago, he began experiencing headache.  Location varies, sometimes behind eyes/forehead, sometimes back of head.  It is persistent but intensity fluctuates.  Usually 3/10 intensity.  When 5/10 for several hours and occurs every other day. Sometimes associated tingling of the scalp.  He feels more forgetful and in a fog.  No associated neck pain, nausea, vomiting, photophobia, phonophobia, dizziness, visual disturbance, sinus pressure/drainage, focal numbness or weakness, neck stiffness or fever.  He had been taking ibuprofen and Aleve which no relief.  On 06/29/2020 he woke up from sleep at 4 AM with a particularly severe headache and was seen in the ED.  CT head personally reviewed was unremarkable.  He was treated with Toradol.  No prior history of migraines.  No preceding head trauma or illness.  No new environmental factors other than getting a new dog.  However typical allergies (seasonal) are itching eyes.  History of anxiety and reports increased stress since working from home and caring for his two small children.     Current acetaminophen or ibuprofen (takes  daily) Past ibuprofen, a muscle relaxer (cause drowsiness), naproxen  History of 2 concussions while playing sports.  History of alcohol poisoning when he was young.    PAST MEDICAL HISTORY: Past Medical History:  Diagnosis Date   Anxiety    Environmental allergies     PAST SURGICAL HISTORY: Past Surgical History:  Procedure Laterality Date   ANTERIOR CRUCIATE LIGAMENT REPAIR Right    WISDOM TOOTH EXTRACTION      MEDICATIONS: Current Outpatient Medications on File Prior to Visit  Medication Sig Dispense Refill   Multiple Vitamin (MULTIVITAMIN WITH MINERALS) TABS tablet Take 1 tablet by mouth daily.     naproxen (NAPROSYN) 500 MG tablet Take 1 tablet (500 mg total) by mouth 2 (two) times daily. 30 tablet 0   No current facility-administered medications on file prior to visit.    ALLERGIES: No Known Allergies  FAMILY HISTORY: Family History  Problem Relation Age of Onset   Hypertension Father    Prostate cancer Father    Dementia Father        vascular   Bipolar disorder Father    Alzheimer's disease Maternal Grandmother    Dementia Paternal Grandfather        lots of psychiatric issues    Objective:  Blood pressure 117/80, pulse 79, height 5\' 9"  (1.753 m), weight 183 lb 3.2 oz (83.1 kg), SpO2 97 %. General: No acute distress.  Patient appears well-groomed.   Head:  Normocephalic/atraumatic Eyes:  fundi examined but not visualized Neck: supple, no paraspinal tenderness, full range of motion Back: No paraspinal tenderness Heart: regular  rate and rhythm Lungs: Clear to auscultation bilaterally. Vascular: No carotid bruits. Neurological Exam: Mental status: alert and oriented to person, place, and time, recent and remote memory intact, fund of knowledge intact, attention and concentration intact, speech fluent and not dysarthric, language intact. Cranial nerves: CN I: not tested CN II: pupils equal, round and reactive to light, visual fields intact CN III, IV,  VI:  full range of motion, no nystagmus, no ptosis CN V: facial sensation intact. CN VII: upper and lower face symmetric CN VIII: hearing intact CN IX, X: gag intact, uvula midline CN XI: sternocleidomastoid and trapezius muscles intact CN XII: tongue midline Bulk & Tone: normal, no fasciculations. Motor:  muscle strength 5/5 throughout Sensation:  Pinprick, temperature and vibratory sensation intact. Deep Tendon Reflexes:  2+ throughout,  toes downgoing.   Finger to nose testing:  Without dysmetria.   Heel to shin:  Without dysmetria.   Gait:  Normal station and stride.  Romberg negative.    Thank you for allowing me to take part in the care of this patient.  Shon Millet, DO  CC: Dahlia Byes, NP

## 2020-09-08 ENCOUNTER — Other Ambulatory Visit: Payer: Self-pay

## 2020-09-08 ENCOUNTER — Encounter: Payer: Self-pay | Admitting: Neurology

## 2020-09-08 ENCOUNTER — Ambulatory Visit (INDEPENDENT_AMBULATORY_CARE_PROVIDER_SITE_OTHER): Payer: 59 | Admitting: Neurology

## 2020-09-08 ENCOUNTER — Other Ambulatory Visit (INDEPENDENT_AMBULATORY_CARE_PROVIDER_SITE_OTHER): Payer: 59

## 2020-09-08 VITALS — BP 117/80 | HR 79 | Ht 69.0 in | Wt 183.2 lb

## 2020-09-08 DIAGNOSIS — R413 Other amnesia: Secondary | ICD-10-CM

## 2020-09-08 DIAGNOSIS — R519 Headache, unspecified: Secondary | ICD-10-CM

## 2020-09-08 LAB — VITAMIN B12: Vitamin B-12: 456 pg/mL (ref 211–911)

## 2020-09-08 LAB — TSH: TSH: 3.04 u[IU]/mL (ref 0.35–5.50)

## 2020-09-08 MED ORDER — NORTRIPTYLINE HCL 10 MG PO CAPS: 10.0000 mg | ORAL_CAPSULE | Freq: Every day | ORAL | 5 refills | Status: DC

## 2020-09-08 NOTE — Patient Instructions (Signed)
Start nortriptyline 10mg  at bedtime.  Keep headache diary to keep track of headaches.  If no improvement in 4 weeks, contact me and I will increase dose. Limit use of pain relievers to no more than 2 days out of week to prevent risk of rebound or medication-overuse headache. MRI of brain with and without contrast Check B12 and TSH

## 2020-09-09 NOTE — Progress Notes (Signed)
Pt advised of her lab results.

## 2020-09-17 ENCOUNTER — Ambulatory Visit: Payer: Self-pay | Admitting: Neurology

## 2020-09-21 ENCOUNTER — Ambulatory Visit
Admission: RE | Admit: 2020-09-21 | Discharge: 2020-09-21 | Disposition: A | Discharge status: Home / Self Care | Payer: 59 | Source: Ambulatory Visit | Attending: Neurology | Admitting: Neurology

## 2020-09-21 ENCOUNTER — Other Ambulatory Visit: Payer: Self-pay

## 2020-09-21 DIAGNOSIS — R413 Other amnesia: Secondary | ICD-10-CM

## 2020-09-21 DIAGNOSIS — R519 Headache, unspecified: Secondary | ICD-10-CM

## 2020-09-21 MED ORDER — GADOBENATE DIMEGLUMINE 529 MG/ML IV SOLN
17.0000 mL | Freq: Once | INTRAVENOUS | Status: AC | PRN
  Administered 2020-09-21: 17 mL via INTRAVENOUS

## 2020-09-25 ENCOUNTER — Telehealth: Payer: Self-pay | Admitting: Neurology

## 2020-09-25 NOTE — Telephone Encounter (Signed)
Called patient and informed him of MRI results  "Pls let patient know brain MRI was normal, no evidence of tumor, stroke, or bleed. Thanks"  Patient verbalized understanding and had no further questions or concerns.

## 2020-09-25 NOTE — Telephone Encounter (Signed)
Please see results note.

## 2020-09-25 NOTE — Telephone Encounter (Signed)
Pt called in to see if we have gotten the results of his MRI back?

## 2020-10-01 NOTE — Progress Notes (Signed)
Pt advised of his MRI [facility] results.

## 2020-11-12 ENCOUNTER — Other Ambulatory Visit: Payer: Self-pay | Admitting: Neurology

## 2020-12-15 ENCOUNTER — Ambulatory Visit: Payer: 59 | Attending: Internal Medicine

## 2020-12-15 ENCOUNTER — Other Ambulatory Visit (HOSPITAL_BASED_OUTPATIENT_CLINIC_OR_DEPARTMENT_OTHER): Payer: Self-pay

## 2020-12-15 DIAGNOSIS — Z23 Encounter for immunization: Secondary | ICD-10-CM

## 2020-12-15 MED ORDER — FLUARIX QUADRIVALENT 0.5 ML IM SUSY
PREFILLED_SYRINGE | INTRAMUSCULAR | 0 refills | Status: DC
  Filled 2020-12-15: qty 0.5, 1d supply, fill #0

## 2020-12-15 NOTE — Progress Notes (Signed)
   Covid-19 Vaccination Clinic  Name:  Benjamin Flores    MRN: [id number] DOB: [birth year]  12/15/2020  Mr. Benjamin Flores was observed post Covid-19 immunization for 15 minutes without incident. He was provided with Vaccine Information Sheet and instruction to access the V-Safe system.   Mr. Benjamin Flores was instructed to call 911 with any severe reactions post vaccine: Difficulty breathing  Swelling of face and throat  A fast heartbeat  A bad rash all over body  Dizziness and weakness   Immunizations Administered     Name Date Dose VIS Date Route   Novavax(covid-19) Vaccine 12/15/2020  9:52 AM 0.5 mL 09/24/2020 Intramuscular   Manufacturer: [employer]   Lot: [id number]   NDC: 45038-882-80      '

## 2020-12-15 NOTE — Progress Notes (Signed)
   Covid-19 Vaccination Clinic  Name:  Benjamin Flores    MRN: [id number] DOB: 11/06/1985  12/15/2020  [name] was observed post Covid-19 immunization for 15 minutes without incident. He was provided with Vaccine Information Sheet and instruction to access the V-Safe system.   [name] was instructed to call 911 with any severe reactions post vaccine: Difficulty breathing  Swelling of face and throat  A fast heartbeat  A bad rash all over body  Dizziness and weakness   Immunizations Administered     Name Date Dose VIS Date Route   Novavax(covid-19) Vaccine 12/15/2020  9:52 AM 0.5 mL 09/24/2020 Intramuscular   Manufacturer: [employer]   Lot: [id number]   NDC: 80631-100-10      ' 

## 2021-01-05 ENCOUNTER — Other Ambulatory Visit (HOSPITAL_BASED_OUTPATIENT_CLINIC_OR_DEPARTMENT_OTHER): Payer: Self-pay

## 2021-01-05 MED ORDER — NOVAVAX COVID-19 VACCINE 5 MCG/0.5ML IM SUSP
INTRAMUSCULAR | 0 refills | Status: DC
  Filled 2021-01-05: qty 0.5, 1d supply, fill #0

## 2021-03-18 ENCOUNTER — Ambulatory Visit: Payer: 59 | Admitting: Neurology

## 2021-09-02 IMAGING — CT CT HEAD W/O CM
4 series · 17 of 47 positions shown, 19 images · non-contrast
Comparison: None.

CLINICAL DATA: Headache for 2 weeks

EXAM:
CT HEAD WITHOUT CONTRAST
TECHNIQUE: Contiguous axial images were obtained from the base of the skull
through the vertex without intravenous contrast.

[Series 2: head wo · axial · 0.45mm/px · z∈[-193,-63]mm · 7 of 36 slices shown, 9 images]
[im 5/36  brain]
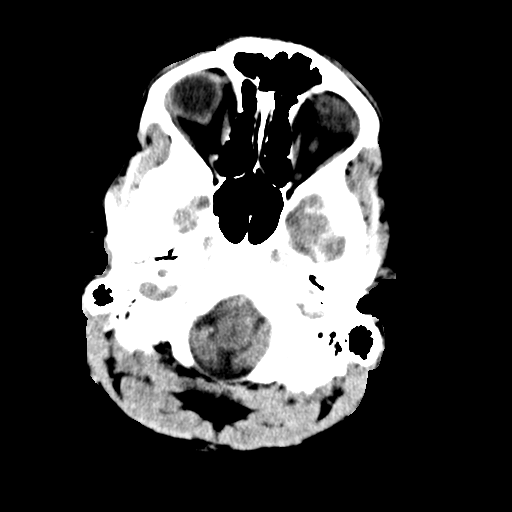
[im 5/36  bone]
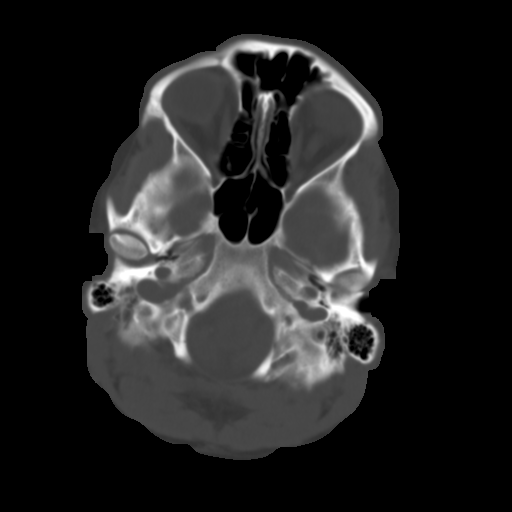
[im 9/36  brain]
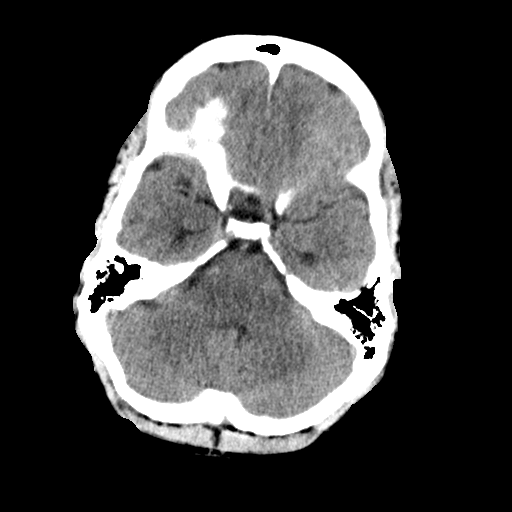
[im 14/36  brain]
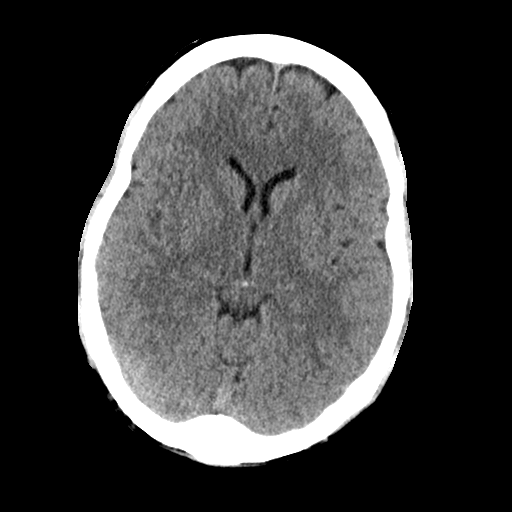
[im 18/36  brain]
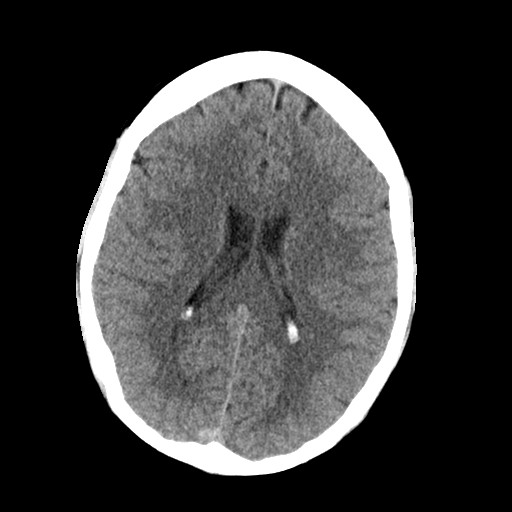
[im 22/36  brain]
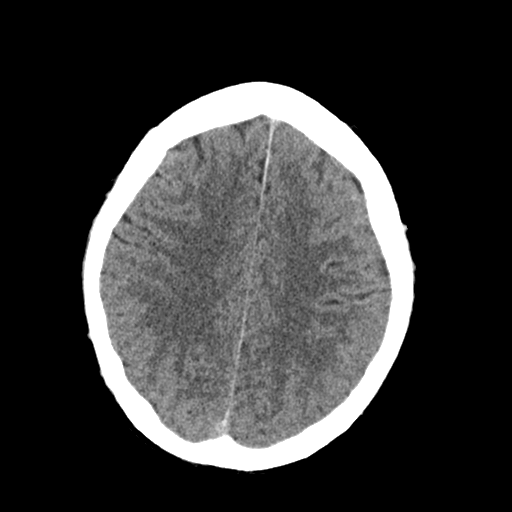
[im 22/36  bone]
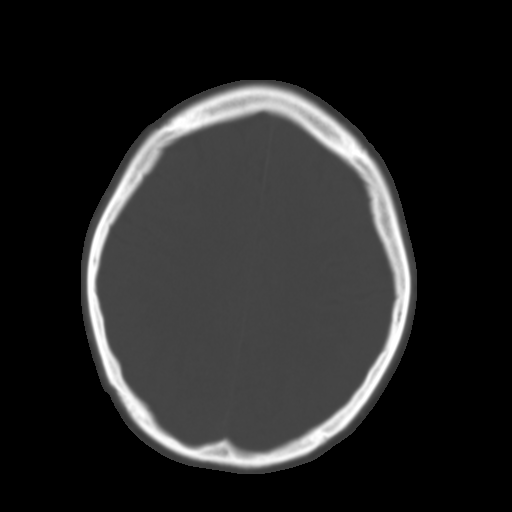
[im 27/36  brain]
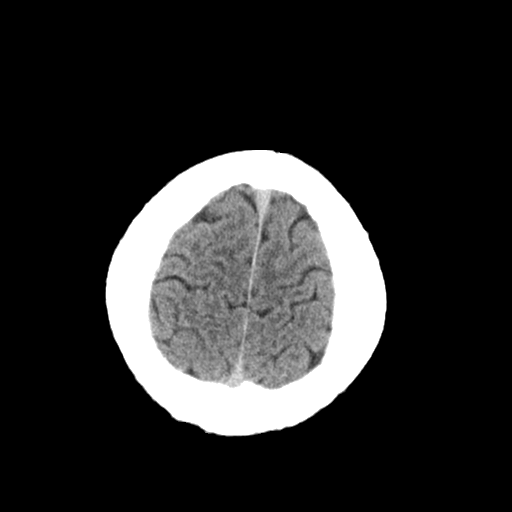
[im 31/36  brain]
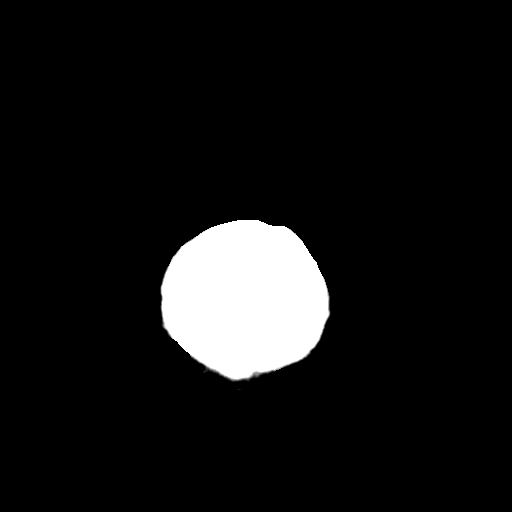

[Series 3: head bone · axial · 0.45mm/px · z∈[-197,-133]mm · 4 of 90 slices shown]
[im 9/90  bone]
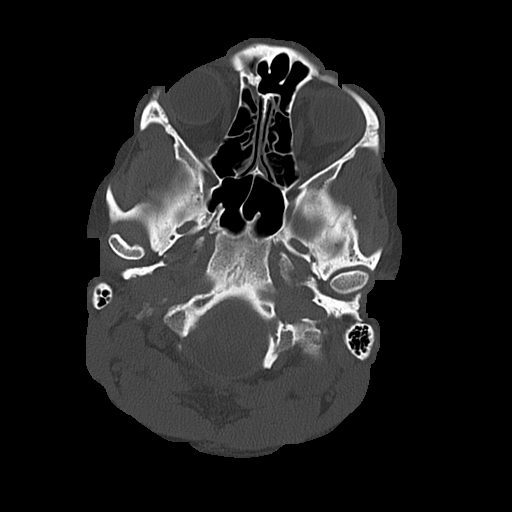
[im 18/90  bone]
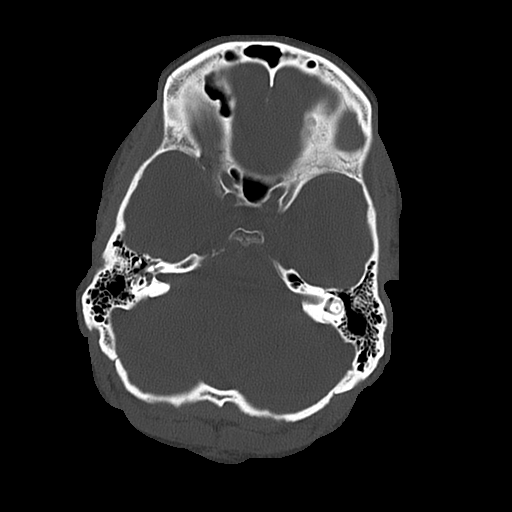
[im 27/90  bone]
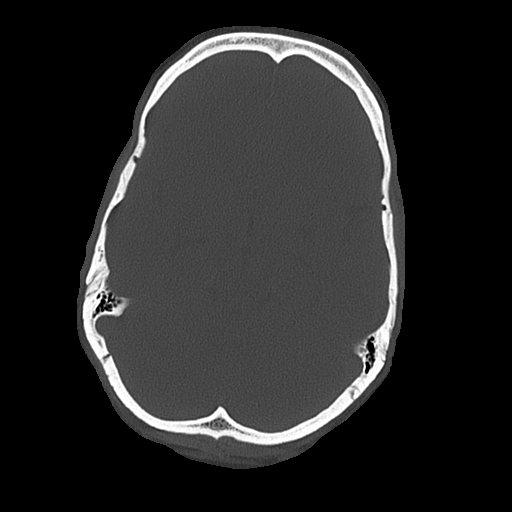
[im 41/90  bone]
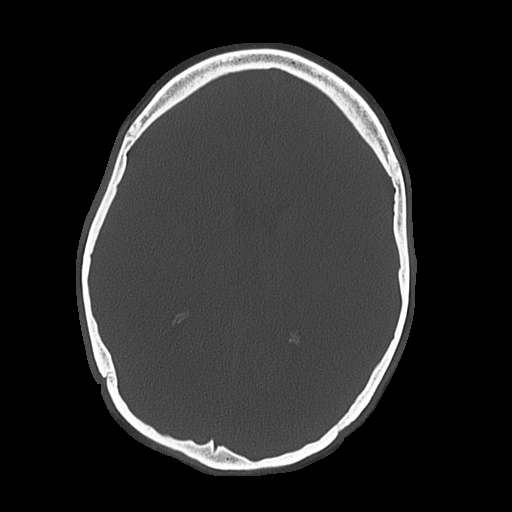

[Series 4: coronal soft · coronal · 0.36mm/px · 3 of 75 slices shown]
[im 25/75  brain]
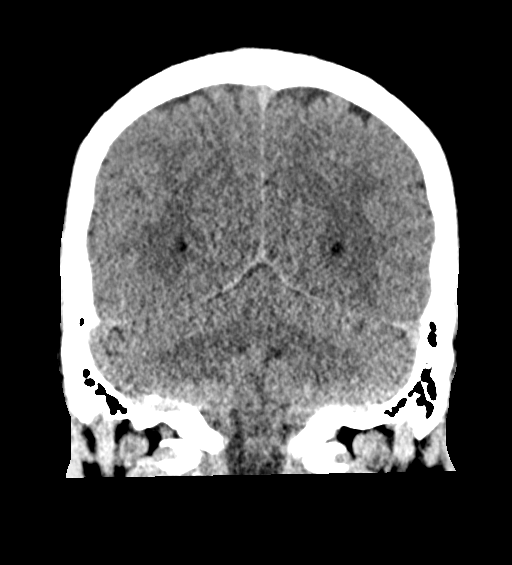
[im 33/75  brain]
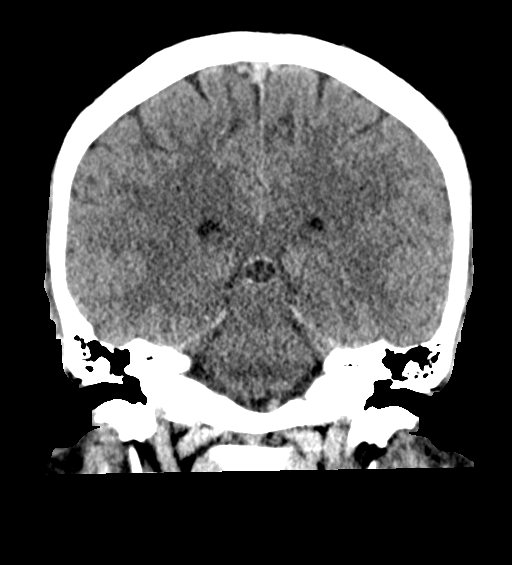
[im 42/75  brain]
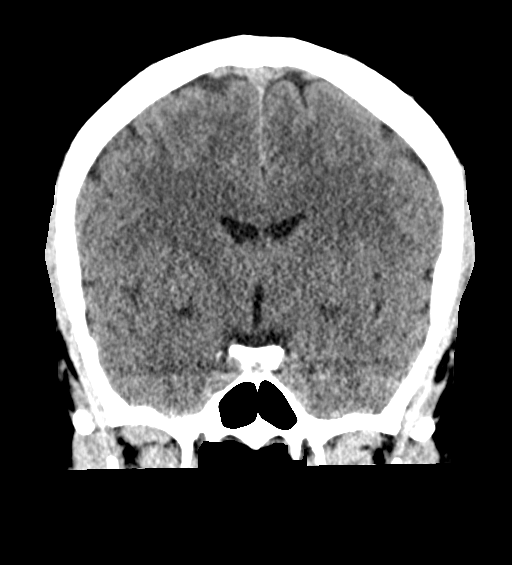

[Series 5: sagittal soft · sagittal · 0.39mm/px · 3 of 61 slices shown]
[im 21/61  brain]
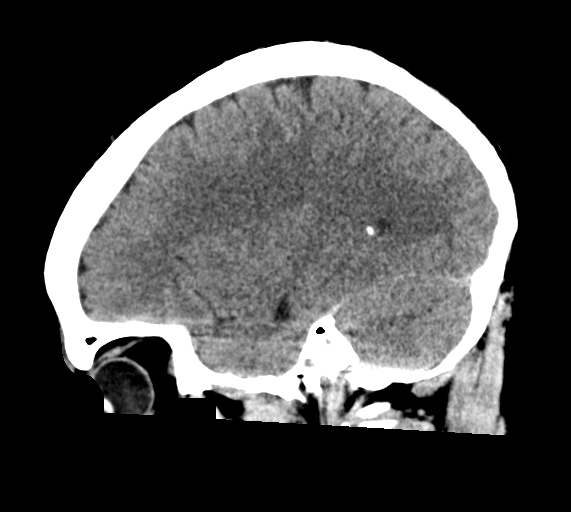
[im 31/61  brain]
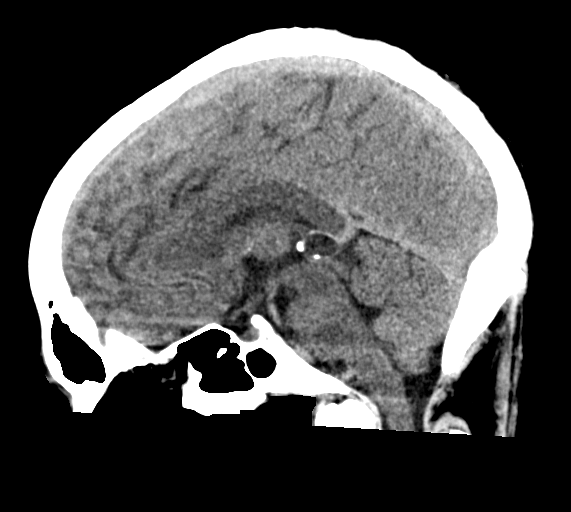
[im 41/61  brain]
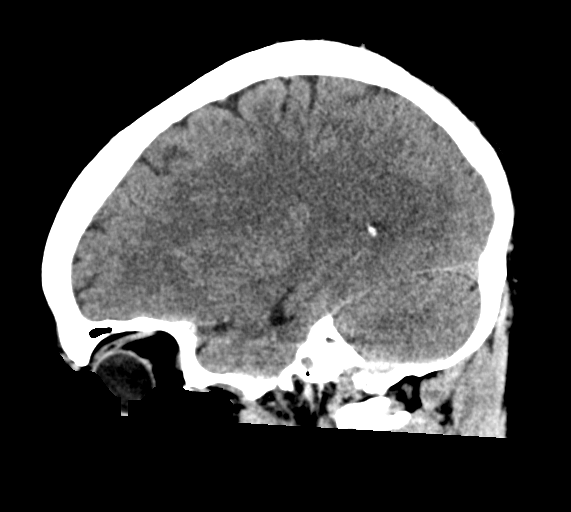

[17 of 47 positions shown; findings below may reference images not displayed]

FINDINGS: Brain: Ventricles and sulci are normal in size and configuration.
There is no intracranial mass, hemorrhage, extra-axial fluid
collection, or midline shift. Brain parenchyma appears unremarkable.
No evident acute infarct.

Vascular: No hyperdense vessel.  No evident vascular calcification.

Skull: Bony calvarium appears intact.

Sinuses/Orbits: Visualized paranasal sinuses are clear. Visualized
orbits appear symmetric bilaterally.

Other: Mastoid air cells are clear.
IMPRESSION: Study within normal limits.

## 2022-12-02 ENCOUNTER — Other Ambulatory Visit (HOSPITAL_BASED_OUTPATIENT_CLINIC_OR_DEPARTMENT_OTHER): Payer: Self-pay

## 2023-10-14 ENCOUNTER — Telehealth: Payer: Self-pay | Admitting: Hematology and Oncology

## 2023-10-14 NOTE — Telephone Encounter (Signed)
 Rescheduled appointment per room/resource. Talked with the patient and he is aware of the changes made to his upcoming appointments.

## 2023-10-18 ENCOUNTER — Inpatient Hospital Stay: Payer: Self-pay | Admitting: Internal Medicine

## 2023-10-18 ENCOUNTER — Inpatient Hospital Stay: Payer: Self-pay

## 2023-10-19 ENCOUNTER — Inpatient Hospital Stay (HOSPITAL_BASED_OUTPATIENT_CLINIC_OR_DEPARTMENT_OTHER): Payer: Self-pay | Admitting: Hematology and Oncology

## 2023-10-19 ENCOUNTER — Inpatient Hospital Stay: Payer: Self-pay | Attending: Hematology and Oncology

## 2023-10-19 VITALS — BP 118/72 | HR 70 | Temp 98.2°F | Resp 14 | Wt 175.2 lb

## 2023-10-19 DIAGNOSIS — D709 Neutropenia, unspecified: Secondary | ICD-10-CM | POA: Insufficient documentation

## 2023-10-19 DIAGNOSIS — D72819 Decreased white blood cell count, unspecified: Secondary | ICD-10-CM

## 2023-10-19 DIAGNOSIS — Z87891 Personal history of nicotine dependence: Secondary | ICD-10-CM

## 2023-10-19 LAB — CMP (CANCER CENTER ONLY)
ALT: 28 U/L (ref 0–44)
AST: 23 U/L (ref 15–41)
Albumin: 4.7 g/dL (ref 3.5–5.0)
Alkaline Phosphatase: 57 U/L (ref 38–126)
Anion gap: 6 (ref 5–15)
BUN: 17 mg/dL (ref 6–20)
CO2: 28 mmol/L (ref 22–32)
Calcium: 9.6 mg/dL (ref 8.9–10.3)
Chloride: 104 mmol/L (ref 98–111)
Creatinine: 0.81 mg/dL (ref 0.61–1.24)
GFR, Estimated: >60 mL/min (ref >60)
Glucose, Bld: 89 mg/dL (ref 70–99)
Potassium: 3.9 mmol/L (ref 3.5–5.1)
Sodium: 138 mmol/L (ref 135–145)
Total Bilirubin: 0.5 mg/dL (ref 0.0–1.2)
Total Protein: 7.7 g/dL (ref 6.5–8.1)

## 2023-10-19 LAB — CBC WITH DIFFERENTIAL (CANCER CENTER ONLY)
Abs Immature Granulocytes: 0.01 K/uL (ref 0.00–0.07)
Basophils Absolute: 0 K/uL (ref 0.0–0.1)
Basophils Relative: 1 %
Eosinophils Absolute: 0.2 K/uL (ref 0.0–0.5)
Eosinophils Relative: 5 %
HCT: 45 % (ref 39.0–52.0)
Hemoglobin: 15.8 g/dL (ref 13.0–17.0)
Immature Granulocytes: 0 %
Lymphocytes Relative: 31 %
Lymphs Abs: 1.4 K/uL (ref 0.7–4.0)
MCH: 31.2 pg (ref 26.0–34.0)
MCHC: 35.1 g/dL (ref 30.0–36.0)
MCV: 88.8 fL (ref 80.0–100.0)
Monocytes Absolute: 0.5 K/uL (ref 0.1–1.0)
Monocytes Relative: 10 %
Neutro Abs: 2.5 K/uL (ref 1.7–7.7)
Neutrophils Relative %: 53 %
Platelet Count: 209 K/uL (ref 150–400)
RBC: 5.07 MIL/uL (ref 4.22–5.81)
RDW: 12.1 % (ref 11.5–15.5)
WBC Count: 4.6 K/uL (ref 4.0–10.5)
nRBC: 0 % (ref 0.0–0.2)

## 2023-10-19 LAB — VITAMIN B12: Vitamin B-12: 729 pg/mL (ref 180–914)

## 2023-10-19 LAB — HEPATITIS C ANTIBODY: HCV Ab: NONREACTIVE

## 2023-10-19 LAB — HEPATITIS B SURFACE ANTIBODY,QUALITATIVE: Hep B S Ab: REACTIVE — AB

## 2023-10-19 LAB — FOLATE: Folate: 16 ng/mL (ref >5.9)

## 2023-10-19 LAB — TSH: TSH: 3.11 u[IU]/mL (ref 0.350–4.500)

## 2023-10-19 LAB — LACTATE DEHYDROGENASE: LDH: 159 U/L (ref 98–192)

## 2023-10-19 LAB — HEPATITIS B SURFACE ANTIGEN: Hepatitis B Surface Ag: NONREACTIVE

## 2023-10-19 LAB — HIV ANTIBODY (ROUTINE TESTING W REFLEX): HIV Screen 4th Generation wRfx: NONREACTIVE

## 2023-10-19 NOTE — Progress Notes (Signed)
 [facility] [phone]   [phone]  INITIAL CONSULT NOTE  Patient Care Team: [facility] as PCP - General (Family Medicine) Anner Alm ORN, MD as PCP - Cardiology (Cardiology)  Hematological/Oncological History # Leukopenia  07/18/2023: WBC 3.6, Hgb 14.9, MCV 90.8, Plt 192 10/19/2023: establish care with Dr. Federico   CHIEF COMPLAINTS/PURPOSE OF CONSULTATION:  Leukopenia    HISTORY OF PRESENTING ILLNESS:  Benjamin Flores 38 y.o. male with medical history significant for ADHD and anxiety who presents for evaluation of leukopenia.   On review of the previous records Benjamin Flores had labs drawn on 07/18/2023 which showed a white blood cell count 3.6, hemoglobin 14.9, MCV 90.8, and platelets of 192.  Due to concern for these findings the patient was referred to hematology for further evaluation and management.  On exam today Benjamin Flores has had modestly low white blood cell counts for at least 5 years.  He reports that he consistently has white blood cells below 4000.  He notes that it he has not had any issues with recurrent infections such as runny nose, sore throat, cough.  He denies any fevers, chills, sweats.  He reports he eats a regular diet and is not a vegetarian or vegan.  He does enjoy red meat approximately twice per week.  He does not eat much in the way of fruits or veggies.  He does not have any inflammatory conditions such as lupus, rheumatoid arthritis, or psoriasis.  He reports that he does have some issues with tension headaches but otherwise has been well recently.  On further discussion he reports that his mother and father both had thyroid  disease.  His father had prostate cancer and is deceased and his mother has dementia.  He reports a maternal grandmother had breast and skin cancer.  He has 2 healthy children.  He is a former smoker having quit 10 years ago.  He reports he does not drink alcohol very much, only once or  twice per week.  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  Overall he feels well and has no additional questions concerns or complaints today.  Full 10 point ROS is otherwise negative.  MEDICAL HISTORY:  Past Medical History:  Diagnosis Date   Anxiety    Environmental allergies    Headache     SURGICAL HISTORY: Past Surgical History:  Procedure Laterality Date   ANTERIOR CRUCIATE LIGAMENT REPAIR Right    WISDOM TOOTH EXTRACTION      SOCIAL HISTORY: Social History   Socioeconomic History   Marital status: Married    Spouse name: Not on file   Number of children: Not on file   Years of education: Not on file   Highest education level: Not on file  Occupational History   Not on file  Tobacco Use   Smoking status: Former    Current packs/day: 0.00    Types: Cigarettes    Quit date: 2012    Years since quitting: 13.6   Smokeless tobacco: Never  Vaping Use   Vaping status: Never Used  Substance and Sexual Activity   Alcohol use: Yes    Comment: socially   Drug use: No   Sexual activity: Not on file  Other Topics Concern   Not on file  Social History Narrative   Married - 1 child (1.5 y/o dtr).   Used to smoke 1/2 ppd x 6-8 yrs - quit 6 yr ago   Social  Drivers of Corporate investment banker Strain: Not on file  Food Insecurity: No Food Insecurity (10/19/2023)   Hunger Vital Sign    Worried About Running Out of Food in the Last Year: Never true    Ran Out of Food in the Last Year: Never true  Transportation Needs: No Transportation Needs (10/19/2023)   PRAPARE - Administrator, Civil Service (Medical): No    Lack of Transportation (Non-Medical): No  Physical Activity: Not on file  Stress: Not on file  Social Connections: Not on file  Intimate Partner Violence: Not At Risk (10/19/2023)   Humiliation, Afraid, Rape, and Kick questionnaire    Fear of Current or Ex-Partner: No    Emotionally Abused: No    Physically Abused: No    Sexually  Abused: No    FAMILY HISTORY: Family History  Problem Relation Age of Onset   Hypertension Father    Prostate cancer Father    Dementia Father        vascular   Bipolar disorder Father    Alzheimer's disease Maternal Grandmother    Dementia Paternal Grandfather        lots of psychiatric issues   Heart murmur Daughter     ALLERGIES:  has no known allergies.  MEDICATIONS:  Current Outpatient Medications  Medication Sig Dispense Refill   Omega-3 Fatty Acids (FISH OIL CONCENTRATE PO) Take by mouth.     VITAMIN D, CHOLECALCIFEROL, PO Take by mouth.     Multiple Vitamin (MULTIVITAMIN WITH MINERALS) TABS tablet Take 1 tablet by mouth daily.     naproxen  (NAPROSYN ) 500 MG tablet Take 1 tablet (500 mg total) by mouth 2 (two) times daily. 30 tablet 0   saccharomyces boulardii (FLORASTOR) 250 MG capsule Take 250 mg by mouth 2 (two) times daily.     No current facility-administered medications for this visit.    REVIEW OF SYSTEMS:   Constitutional: ( - ) fevers, ( - )  chills , ( - ) night sweats Eyes: ( - ) blurriness of vision, ( - ) double vision, ( - ) watery eyes Ears, nose, mouth, throat, and face: ( - ) mucositis, ( - ) sore throat Respiratory: ( - ) cough, ( - ) dyspnea, ( - ) wheezes Cardiovascular: ( - ) palpitation, ( - ) chest discomfort, ( - ) lower extremity swelling Gastrointestinal:  ( - ) nausea, ( - ) heartburn, ( - ) change in bowel habits Skin: ( - ) abnormal skin rashes Lymphatics: ( - ) new lymphadenopathy, ( - ) easy bruising Neurological: ( - ) numbness, ( - ) tingling, ( - ) new weaknesses Behavioral/Psych: ( - ) mood change, ( - ) new changes  All other systems were reviewed with the patient and are negative.  PHYSICAL EXAMINATION:  Vitals:   10/19/23 1318  BP: 118/72  Pulse: 70  Resp: 14  Temp: 98.2 F (36.8 C)  SpO2: 98%   Filed Weights   10/19/23 1318  Weight: 175 lb 3.2 oz (79.5 kg)    GENERAL: well appearing middle-age Caucasian male in  NAD  SKIN: skin color, texture, turgor are normal, no rashes or significant lesions EYES: conjunctiva are pink and non-injected, sclera clear LUNGS: clear to auscultation and percussion with normal breathing effort HEART: regular rate & rhythm and no murmurs and no lower extremity edema Musculoskeletal: no cyanosis of digits and no clubbing  PSYCH: alert & oriented x 3, fluent speech NEURO: no focal motor/sensory  deficits  LABORATORY DATA:  I have reviewed the data as listed    Latest Ref Rng & Units 10/19/2023    2:05 PM 06/29/2020    1:35 PM 09/17/2016   12:39 PM  CBC  WBC 4.0 - 10.5 K/uL 4.6  4.3  3.8   Hemoglobin 13.0 - 17.0 g/dL 84.1  85.1  84.9   Hematocrit 39.0 - 52.0 % 45.0  43.1  43.0   Platelets 150 - 400 K/uL 209  182  164        Latest Ref Rng & Units 10/19/2023    2:05 PM 06/29/2020    1:35 PM 09/17/2016   12:39 PM  CMP  Glucose 70 - 99 mg/dL 89  90  99   BUN 6 - 20 mg/dL 17  18  15    Creatinine 0.61 - 1.24 mg/dL 9.18  9.17  9.09   Sodium 135 - 145 mmol/L 138  139  139   Potassium 3.5 - 5.1 mmol/L 3.9  3.8  3.9   Chloride 98 - 111 mmol/L 104  105  105   CO2 22 - 32 mmol/L 28  25  24    Calcium 8.9 - 10.3 mg/dL 9.6  8.6  9.2   Total Protein 6.5 - 8.1 g/dL 7.7  6.6    Total Bilirubin 0.0 - 1.2 mg/dL 0.5  0.5    Alkaline Phos 38 - 126 U/L 57  52    AST 15 - 41 U/L 23  25    ALT 0 - 44 U/L 28  32       ASSESSMENT & PLAN Benjamin Flores 38 y.o. male with medical history significant for ADHD and anxiety who presents for evaluation of leukopenia.   After review of the labs, review of the records, and discussion with the patient the patients findings are most consistent with leukopenia of unclear etiology.  The differential for neutropenia includes nutritional deficiency, inflammatory disorder, medication side effect, infectious etiology, or congenital condition (such as benign ethnic neutropenia). The patient is not taking any medications known to cause neutropenia. Workup  for this condition includes viral serologies (HIV, Hep B and Hep C),  vitamin b12/folate, and inflammatory markers with ESR and CRP.    A common cause for low WBC is benign ethnic neutropenia (BEN). This is a condition whereby individuals of African or Mediterranean descent have lower WBC than the general population. The condition typically has an absolute neutrophil count (ANC) between 1000-1500 with no recurrent infections. Patient with this condition have normal functioning immune systems and have no consequences as a result of their low ANC. BEN is a diagnosis of exclusion, so it would require the full above workup to make.    # Leukopenia, Unclear Etiology  --repeat CBC and CMP.  Additionally ordered TSH due to strong family history of hypothyroidism. --infectious serology testing with Hep B, Hep C, and HIV  --nutritional evaluation with Vitamin b12, folate  --inflammatory workup with ESR and CRP  --RTC pending the results of the above studies.      Orders Placed This Encounter  Procedures   CBC with Differential (Cancer Center Only)    Standing Status:   Future    Number of Occurrences:   1    Expiration Date:   10/18/2024   CMP (Cancer Center only)    Standing Status:   Future    Number of Occurrences:   1    Expiration Date:   10/18/2024   Lactate  dehydrogenase (LDH)    Standing Status:   Future    Number of Occurrences:   1    Expiration Date:   10/18/2024   Vitamin B12    Standing Status:   Future    Number of Occurrences:   1    Expiration Date:   10/18/2024   Methylmalonic acid, serum    Standing Status:   Future    Number of Occurrences:   1    Expiration Date:   10/18/2024   Folate, Serum    Standing Status:   Future    Number of Occurrences:   1    Expiration Date:   10/18/2024   TSH    Standing Status:   Future    Number of Occurrences:   1    Expiration Date:   10/18/2024   HIV antibody (with reflex)    Standing Status:   Future    Number of Occurrences:   1     Expiration Date:   10/18/2024   Hepatitis C antibody    Standing Status:   Future    Number of Occurrences:   1    Expiration Date:   10/18/2024   Hepatitis B surface antigen    Standing Status:   Future    Number of Occurrences:   1    Expiration Date:   10/18/2024   Hepatitis B surface antibody    Standing Status:   Future    Number of Occurrences:   1    Expiration Date:   10/18/2024   Hepatitis B core antibody, total    Standing Status:   Future    Number of Occurrences:   1    Expiration Date:   10/18/2024    All questions were answered. The patient knows to call the clinic with any problems, questions or concerns.  A total of more than 60 minutes were spent on this encounter with face-to-face time and non-face-to-face time, including preparing to see the patient, ordering tests and/or medications, counseling the patient and coordination of care as outlined above.   Norleen IVAR Kidney, MD Department of Hematology/Oncology [facility] Phone: [phone] Pager: [phone] Email: [email or web address]  10/23/2023 4:04 PM

## 2023-10-20 LAB — HEPATITIS B CORE ANTIBODY, TOTAL: HEP B CORE AB: NEGATIVE

## 2023-10-22 LAB — METHYLMALONIC ACID, SERUM: Methylmalonic Acid, Quantitative: 163 nmol/L (ref 0–378)

## 2023-12-20 ENCOUNTER — Ambulatory Visit: Payer: Self-pay | Admitting: *Deleted

## 2023-12-20 NOTE — Telephone Encounter (Signed)
 Just saw this message was routed to me back in Sept.  Called pt to see if he had gotten this message & he had not but states that he saw results on MyChart & assumed everything was OK.  Gave message per Dr Federico & encouraged to f/u with PCP.  Pt expressed understanding.

## 2023-12-20 NOTE — Telephone Encounter (Signed)
-----   Message from Nurse Almarie T sent at 10/24/2023  5:31 PM EDT -----  ----- Message ----- From: Federico Norleen ONEIDA MADISON, MD Sent: 10/23/2023   4:06 PM EDT To: Almarie DELENA Arabia, RN  Please let Mr. Niday know that his blood work returned as normal, with a white blood cell count of 4600 (normal).  Additionally we did not find any nutritional deficiencies, viruses, or inflammation.   Overall his blood work was normal.  At this time I do suspect that he may have a baseline low to low normal white blood cell count.  Given that he does not have any recurrent infections I do not  believe that his mild leukopenia is of clinical significance.  I would recommend that he return to us  if he were to have any worsening of his blood counts or recurrent infections.  Otherwise I would  recommend he follow-up with his PCP on a routine basis. ----- Message ----- From: Rebecka, [facility] [place] Sent: 10/19/2023   2:33 PM EDT To: Norleen ONEIDA Federico MADISON, MD
